# Patient Record
Sex: Male | Born: 1980 | Race: White | Hispanic: No | Marital: Single | Smoking: Current every day smoker
Health system: Southern US, Community
[De-identification: ages and names within clinical notes are randomized; demographics above are authoritative.]

## PROBLEM LIST (undated history)

## (undated) DIAGNOSIS — I456 Pre-excitation syndrome: Secondary | ICD-10-CM

## (undated) HISTORY — PX: CARDIAC ELECTROPHYSIOLOGY MAPPING AND ABLATION: SHX1292

---

## 2010-03-08 ENCOUNTER — Ambulatory Visit: Payer: Self-pay | Admitting: Family Medicine

## 2010-03-09 ENCOUNTER — Ambulatory Visit: Payer: Self-pay | Admitting: Family Medicine

## 2012-03-22 IMAGING — US ABDOMEN ULTRASOUND
1 series · 17 of 25 positions shown · non-contrast
Comparison: none

REASON FOR EXAM: RUQ PAIN
COMMENTS:

[Series 1: abdomen ultrasound · 17 of 70 slices shown]
[im 1/70]
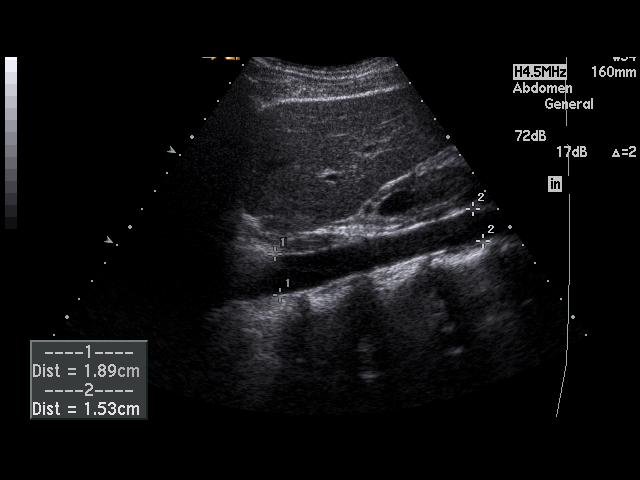
[im 6/70]
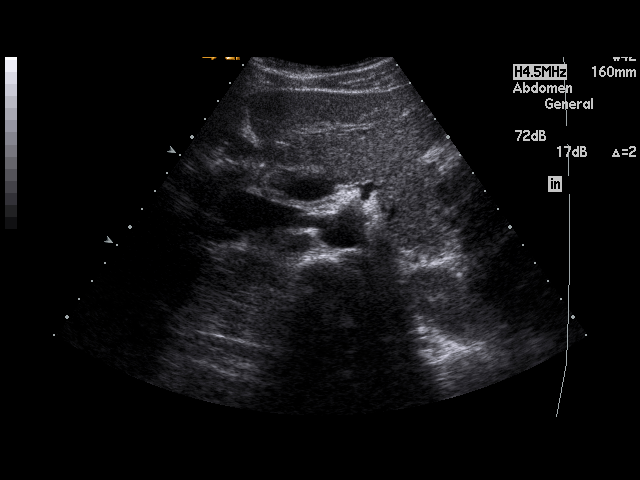
[im 9/70]
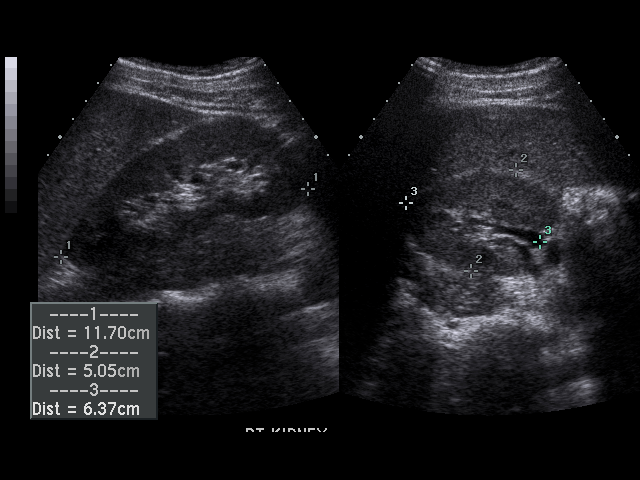
[im 15/70]
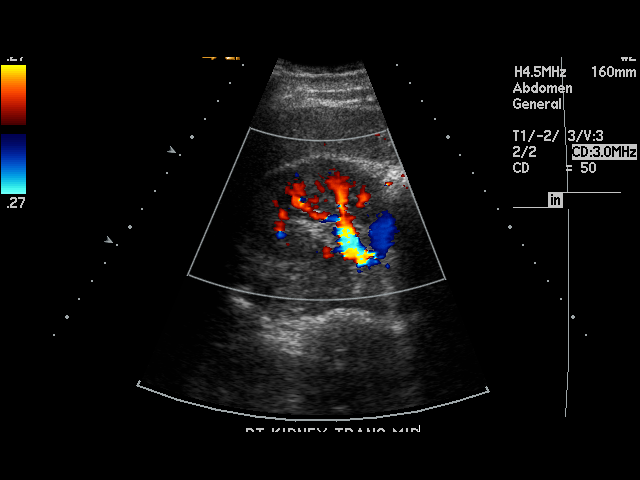
[im 18/70]
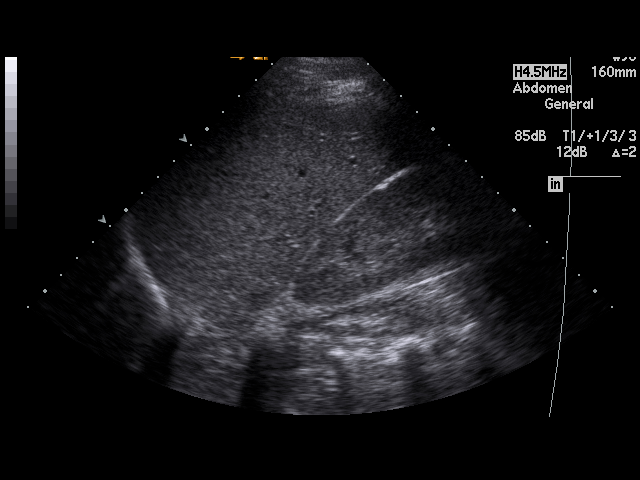
[im 24/70]
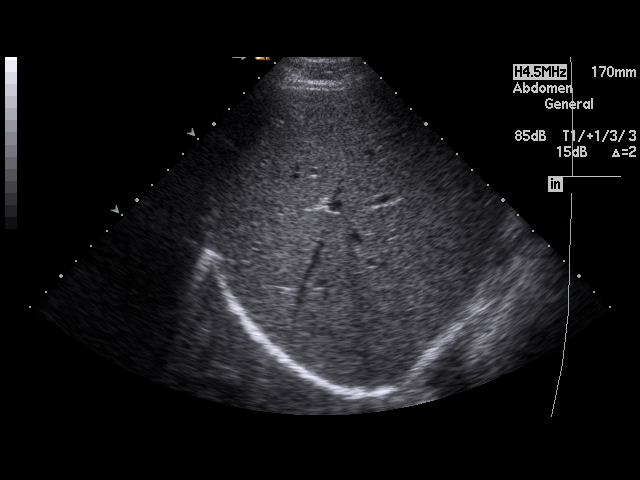
[im 26/70]
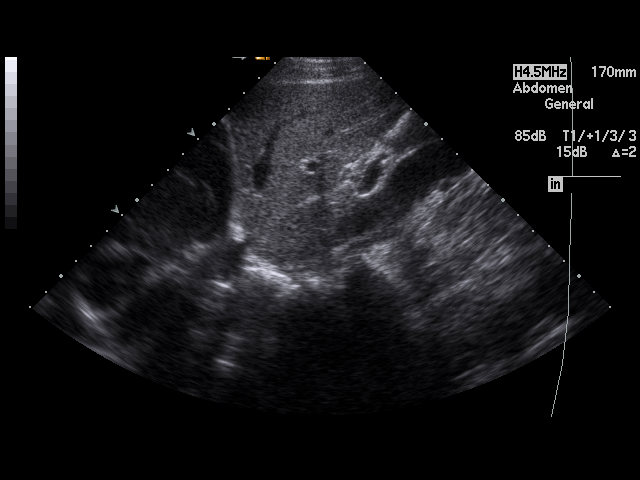
[im 32/70]
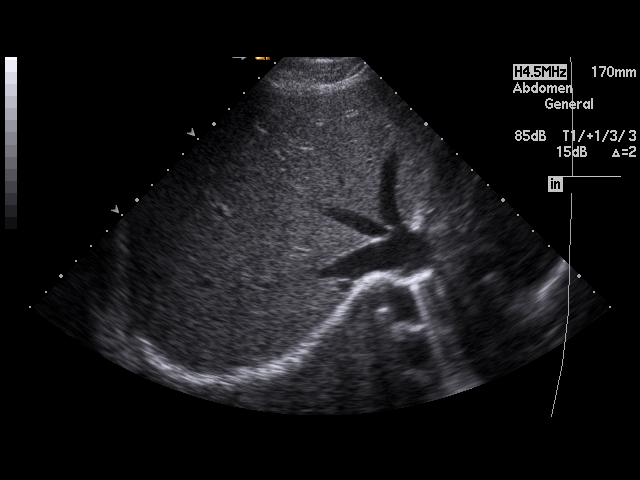
[im 35/70]
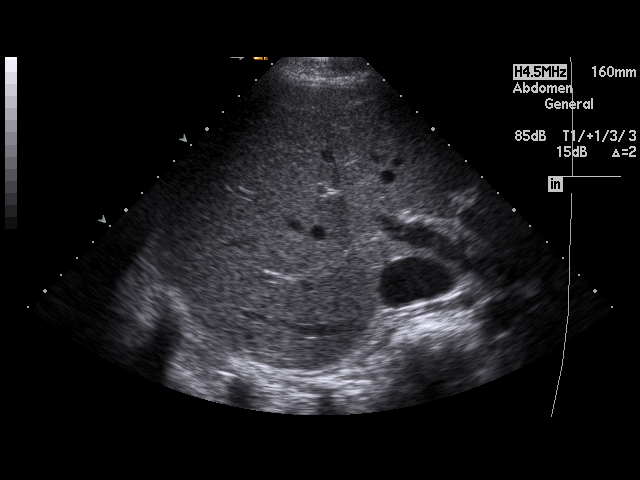
[im 38/70]
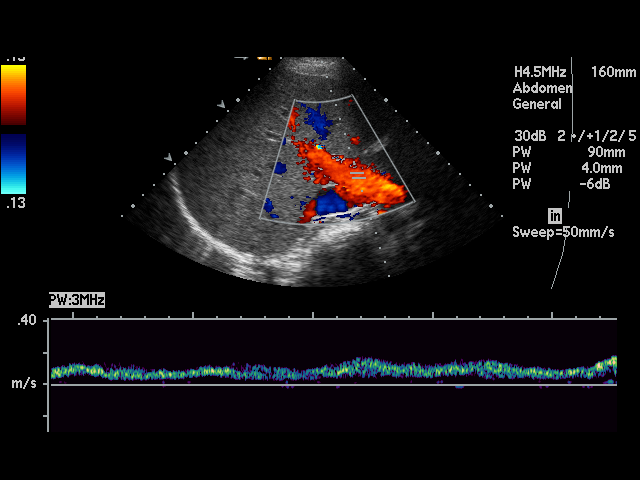
[im 44/70]
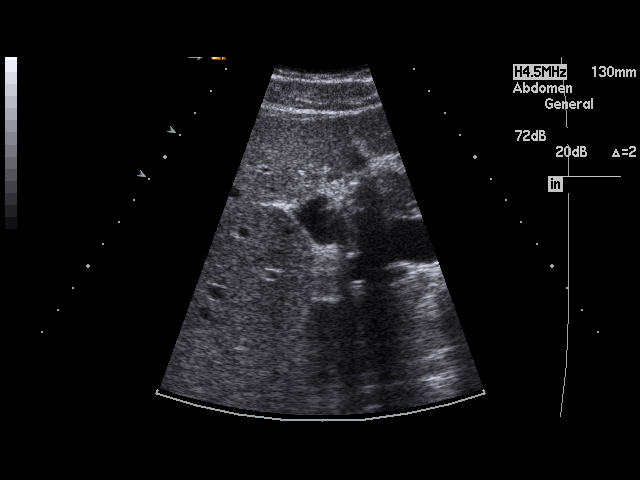
[im 47/70]
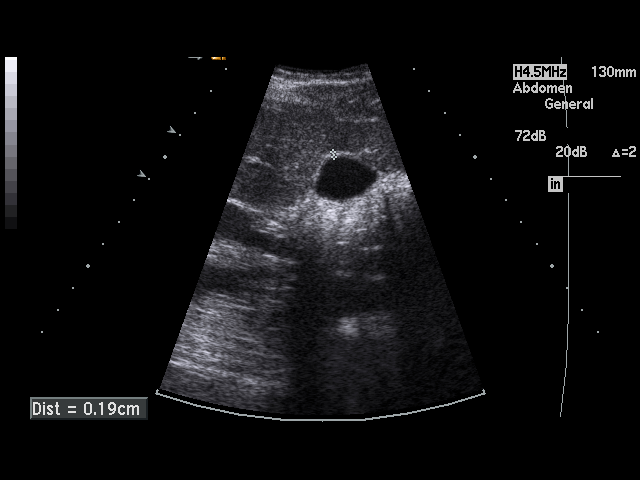
[im 52/70]
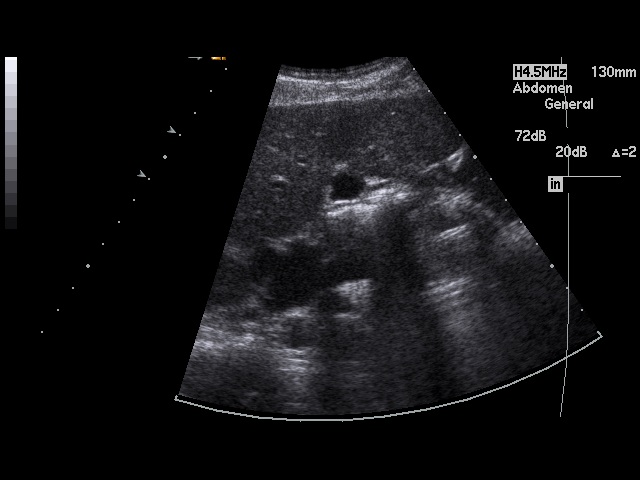
[im 55/70]
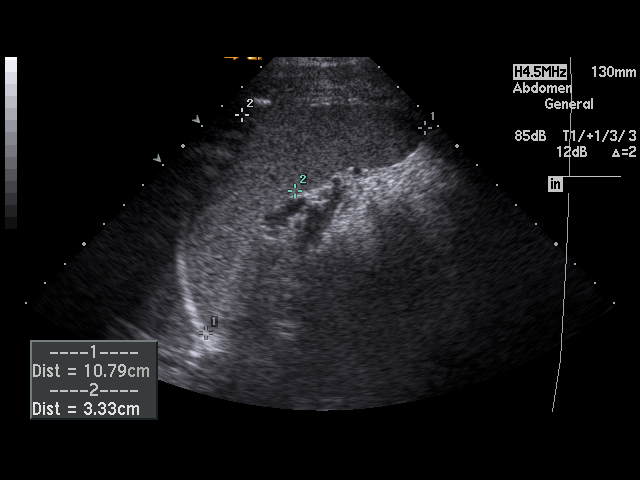
[im 61/70]
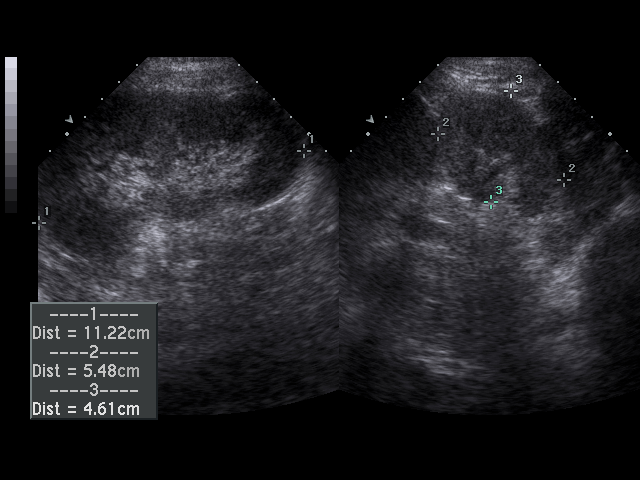
[im 64/70]
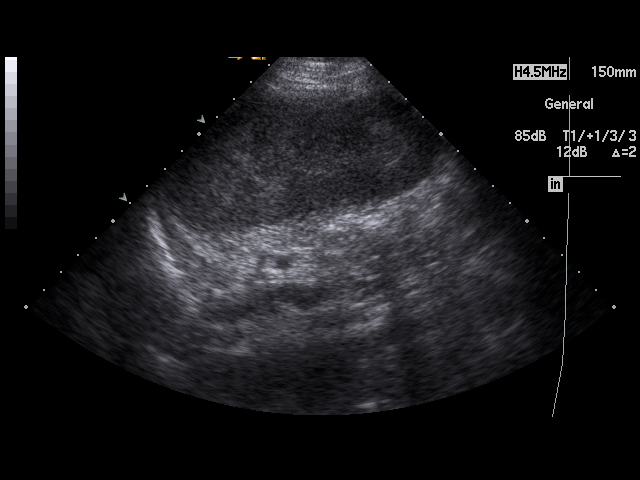
[im 70/70]
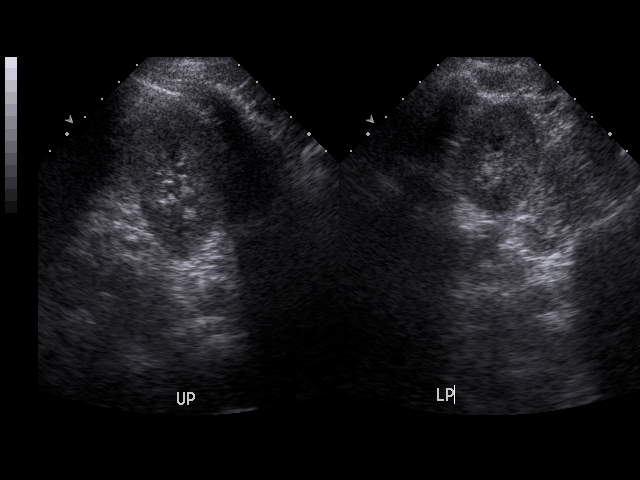

[17 of 25 positions shown; findings below may reference images not displayed]

PROCEDURE:     US  - US ABDOMEN GENERAL SURVEY  - March 09, 2010  [DATE]

RESULT:     The liver, spleen, pancreas, abdominal aorta and inferior vena
cava show no significant abnormalities. No gallstones are seen. There is no
thickening of the gallbladder wall. The common bile duct measures 2.4 mm in
diameter which is within normal limits. The kidneys show no hydronephrosis.
There is no ascites.
IMPRESSION: 1.     No significant abnormalities are noted.

## 2018-05-08 ENCOUNTER — Emergency Department
Admission: EM | Admit: 2018-05-08 | Discharge: 2018-05-08 | Disposition: A | Discharge status: Home / Self Care | Payer: BLUE CROSS/BLUE SHIELD | Attending: Emergency Medicine | Admitting: Emergency Medicine

## 2018-05-08 ENCOUNTER — Encounter: Payer: Self-pay | Admitting: Emergency Medicine

## 2018-05-08 ENCOUNTER — Other Ambulatory Visit: Payer: Self-pay

## 2018-05-08 DIAGNOSIS — L509 Urticaria, unspecified: Secondary | ICD-10-CM | POA: Diagnosis not present

## 2018-05-08 DIAGNOSIS — T7840XA Allergy, unspecified, initial encounter: Secondary | ICD-10-CM | POA: Diagnosis not present

## 2018-05-08 DIAGNOSIS — F17200 Nicotine dependence, unspecified, uncomplicated: Secondary | ICD-10-CM | POA: Diagnosis not present

## 2018-05-08 DIAGNOSIS — I456 Pre-excitation syndrome: Secondary | ICD-10-CM | POA: Insufficient documentation

## 2018-05-08 DIAGNOSIS — M7989 Other specified soft tissue disorders: Secondary | ICD-10-CM

## 2018-05-08 DIAGNOSIS — R2243 Localized swelling, mass and lump, lower limb, bilateral: Secondary | ICD-10-CM | POA: Diagnosis present

## 2018-05-08 HISTORY — DX: Pre-excitation syndrome: I45.6

## 2018-05-08 MED ORDER — IBUPROFEN 600 MG PO TABS: 600.0000 mg | ORAL_TABLET | Freq: Three times a day (TID) | ORAL | 0 refills | Status: AC | PRN

## 2018-05-08 MED ORDER — IBUPROFEN 600 MG PO TABS
600.0000 mg | ORAL_TABLET | Freq: Once | ORAL | Status: AC
Start: 2018-05-08 — End: 2018-05-08
  Administered 2018-05-08: 600 mg via ORAL
  Filled 2018-05-08: qty 1

## 2018-05-08 MED ORDER — METHYLPREDNISOLONE 4 MG PO TBPK: ORAL_TABLET | ORAL | 0 refills | Status: AC

## 2018-05-08 MED ORDER — PREDNISONE 20 MG PO TABS
40.0000 mg | ORAL_TABLET | Freq: Once | ORAL | Status: AC
  Administered 2018-05-08: 40 mg via ORAL
  Filled 2018-05-08: qty 2

## 2018-05-08 NOTE — ED Notes (Signed)
Pt verbalizes understanding of d/c instructions, medications and follow up 

## 2018-05-08 NOTE — ED Provider Notes (Signed)
[facility] Emergency Department Provider Note   ____________________________________________   First MD Initiated Contact with Patient 05/08/18 [phone]     (approximate)  I have reviewed the triage vital signs and the nursing notes.   HISTORY  Chief Complaint Foot Pain    HPI Ray Ramsey is a 37 y.o. male who presents to the ED from home with a chief complaint of feet swelling and hives.  Patient reports wearing new work boots 3 days ago and subsequently noted left foot and right great toe swelling x3 days.  Denies trauma/fall/injury.  Wore a new work short tonight without washing and noted itchy rash to his left trunk.  Denies face or tongue swelling, difficulty breathing, chest pain, abdominal pain, vomiting or diarrhea.  Denies new exposures such as medications, over-the-counter's, foods, detergents/soaps or environmental.   Past Medical History:  Diagnosis Date  . Wolff-Parkinson-White (WPW) syndrome     There are no active problems to display for this patient.   Past Surgical History:  Procedure Laterality Date  . CARDIAC ELECTROPHYSIOLOGY MAPPING AND ABLATION      Prior to Admission medications   Not on File    Allergies Patient has no known allergies.  No family history on file.  Social History Social History   Tobacco Use  . Smoking status: Current Every Day Smoker  . Smokeless tobacco: Never Used  Substance Use Topics  . Alcohol use: Not on file  . Drug use: Not on file    Review of Systems  Constitutional: No fever/chills Eyes: No visual changes. ENT: No sore throat. Cardiovascular: Denies chest pain. Respiratory: Denies shortness of breath. Gastrointestinal: No abdominal pain.  No nausea, no vomiting.  No diarrhea.  No constipation. Genitourinary: Negative for dysuria. Musculoskeletal: Positive for left foot and right great toe swelling.  Negative for back pain. Skin: Positive for rash. Neurological: Negative for  headaches, focal weakness or numbness.   ____________________________________________   PHYSICAL EXAM:  VITAL SIGNS: ED Triage Vitals  Enc Vitals Group     BP 05/08/18 0318 120/68     Pulse Rate 05/08/18 0318 92     Resp 05/08/18 0318 16     Temp 05/08/18 0318 98.7 F (37.1 C)     Temp Source 05/08/18 0318 Oral     SpO2 05/08/18 0318 99 %     Weight 05/08/18 0311 135 lb (61.2 kg)     Height 05/08/18 0311 5\' 7"  (1.702 m)     Head Circumference --      Peak Flow --      Pain Score 05/08/18 0311 10     Pain Loc --      Pain Edu? --      Excl. in GC? --     Constitutional: Alert and oriented. Well appearing and in no acute distress. Eyes: Conjunctivae are normal. PERRL. EOMI. Head: Atraumatic. Nose: No congestion/rhinnorhea. Mouth/Throat: Mucous membranes are moist.  No tongue or lip angioedema.  No hoarse or muffled voice.  No drooling. Neck: No stridor.   Cardiovascular: Normal rate, regular rhythm. Grossly normal heart sounds.  Good peripheral circulation. Respiratory: Normal respiratory effort.  No retractions. Lungs CTAB.  No wheezing. Gastrointestinal: Soft and nontender. No distention. No abdominal bruits. No CVA tenderness. Musculoskeletal:  Left foot: Mild swelling to whole foot.  2+ distal pulses.  Brisk, less than 5-second capillary refill. Right foot: Great toe with swelling.  2+ distal pulses.  Brisk, less than 5-second capillary refill. Neurologic:  Normal speech and language. No gross focal neurologic deficits are appreciated. No gait instability. Skin:  Skin is warm, dry and intact.  Urticaria noted to left trunk.   Psychiatric: Mood and affect are normal. Speech and behavior are normal.  ____________________________________________   LABS (all labs ordered are listed, but only abnormal results are displayed)  Labs Reviewed - No data to  display ____________________________________________  EKG  None ____________________________________________  RADIOLOGY  ED MD interpretation: None  Official radiology report(s): No results found.  ____________________________________________   PROCEDURES  Procedure(s) performed: None  Procedures  Critical Care performed: No  ____________________________________________   INITIAL IMPRESSION / ASSESSMENT AND PLAN / ED COURSE  As part of my medical decision making, I reviewed the following data within the electronic MEDICAL RECORD NUMBER Nursing notes reviewed and incorporated, Old chart reviewed and Notes from prior ED visits   37 year old male who presents with feet swelling after wearing new work boots; also with hives after wearing an new, and washer.  Will start Medrol Dosepak, advised Benadryl as needed and follow-up with his PCP.  Strict return precautions given.  Patient verbalizes understanding agrees with plan of care.      ____________________________________________   FINAL CLINICAL IMPRESSION(S) / ED DIAGNOSES  Final diagnoses:  Allergic reaction, initial encounter  Hives  Foot swelling     ED Discharge Orders    None       Note:  This document was prepared using Dragon voice recognition software and may include unintentional dictation errors.    Irean Hong, MD 05/08/18 [phone]

## 2018-05-08 NOTE — ED Triage Notes (Addendum)
Patient ambulatory to triage with steady gait, without difficulty or distress noted; pt reports left foot and right great toe swelling x 3 days with no known injury; st began after getting a pair of new boots

## 2018-05-08 NOTE — Discharge Instructions (Signed)
1.  Take steroid Dosepak as prescribed. 2.  You may take Motrin as needed for discomfort. 3.  Wear podiatric shoe and use crutches as needed for comfort. 4.  Elevate left foot as often as possible to reduce swelling. 5.  Take Benadryl as needed for hives and itching. 6.  Return to the ER for worsening symptoms, persistent vomiting, difficulty breathing or other concerns

## 2018-05-08 NOTE — ED Notes (Signed)
Pt reports swelling, itching and redness to left foot for 3 days. Pt denies injuries or exposures that would cause the symptoms. Pt left foot red and warm to the touch. Swelling noted.Pt denies SOB, CP or other sx's.
